# Patient Record
Sex: Male | Born: 1985 | Race: White | Hispanic: No | Marital: Married
Health system: Southern US, Community
[De-identification: ages and names within clinical notes are randomized; demographics above are authoritative.]

---
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
## 2010-02-08 ENCOUNTER — Emergency Department (HOSPITAL_COMMUNITY): Admission: EM | Admit: 2010-02-08 | Discharge: 2010-02-08 | Payer: Self-pay | Admitting: Emergency Medicine

## 2011-02-21 LAB — POCT I-STAT, CHEM 8
Calcium, Ion: 1.15 mmol/L (ref 1.12–1.32)
Chloride: 107 mEq/L (ref 96–112)
Creatinine, Ser: 0.9 mg/dL (ref 0.4–1.5)
Hemoglobin: 15.6 g/dL (ref 13.0–17.0)
Sodium: 138 mEq/L (ref 135–145)
TCO2: 26 mmol/L (ref 0–100)

## 2011-02-21 LAB — CBC
Hemoglobin: 16.5 g/dL (ref 13.0–17.0)
RBC: 5.32 MIL/uL (ref 4.22–5.81)
WBC: 7.5 10*3/uL (ref 4.0–10.5)

## 2021-02-16 ENCOUNTER — Other Ambulatory Visit: Payer: Self-pay

## 2021-02-17 ENCOUNTER — Ambulatory Visit (INDEPENDENT_AMBULATORY_CARE_PROVIDER_SITE_OTHER): Payer: 59

## 2021-02-17 ENCOUNTER — Ambulatory Visit: Payer: 59 | Admitting: Podiatry

## 2021-02-17 ENCOUNTER — Other Ambulatory Visit: Payer: Self-pay

## 2021-02-17 DIAGNOSIS — S8265XA Nondisplaced fracture of lateral malleolus of left fibula, initial encounter for closed fracture: Secondary | ICD-10-CM

## 2021-02-17 DIAGNOSIS — S82899A Other fracture of unspecified lower leg, initial encounter for closed fracture: Secondary | ICD-10-CM | POA: Diagnosis not present

## 2021-02-17 DIAGNOSIS — S92009A Unspecified fracture of unspecified calcaneus, initial encounter for closed fracture: Secondary | ICD-10-CM

## 2021-02-18 ENCOUNTER — Encounter: Payer: Self-pay | Admitting: Podiatry

## 2021-02-18 NOTE — Progress Notes (Signed)
Subjective:  Patient ID: Reginald Perez, male    DOB: 1986-01-11,  MRN: [id number]  Chief Complaint  Patient presents with  . Fracture    PT stated that he was walking his dog in [place] and he slipped on some mud and he heard something pop and he went to the ER and they told him that he broke his fibula.     35 y.o. male presents with the above complaint.  Patient presents with left ankle fracture that occurred few days ago while he was walking his dog in [place].  He slipped and felt a pop.  He went to the ER and they said that they broke a bone.  He was placed in a cast and was sent to follow-up near his home which will be here.  He has not seen anyone else prior to seeing me here.  He has not seen any foot and ankle specialist.  He denies any other acute complaints.  He would like to discuss if there is surgical intervention is required or can be treated conservatively.  He has been nonweightbearing with crutches to the left lower extremity.   Review of Systems: Negative except as noted in the HPI. Denies N/V/F/Ch.  No past medical history on file.  Current Outpatient Medications:  .  cephALEXin (KEFLEX) 500 MG capsule, Take 500 mg by mouth 3 (three) times daily., Disp: , Rfl:  .  finasteride (PROPECIA) 1 MG tablet, Take 1 mg by mouth daily., Disp: , Rfl:  .  ibuprofen (ADVIL) 800 MG tablet, Take 800 mg by mouth every 8 (eight) hours as needed. for pain, Disp: , Rfl:  .  meloxicam (MOBIC) 7.5 MG tablet, Take 7.5 mg by mouth daily., Disp: , Rfl:  .  Meth-Hyo-M Bl-Na Phos-Ph Sal (PHOSPHASAL) 81.6 MG TABS, Take 1 tablet by mouth 4 (four) times daily as needed., Disp: , Rfl:  .  oxyCODONE (OXY IR/ROXICODONE) 5 MG immediate release tablet, Take 5 mg by mouth every 6 (six) hours as needed., Disp: , Rfl:  .  sulfamethoxazole-trimethoprim (BACTRIM DS) 800-160 MG tablet, Take 1 tablet by mouth 2 (two) times daily., Disp: , Rfl:   Social History   Tobacco Use  Smoking Status Not on file   Smokeless Tobacco Not on file    Not on File Objective:  There were no vitals filed for this visit. There is no height or weight on file to calculate BMI. Constitutional Well developed. Well nourished.  Vascular Dorsalis pedis pulses palpable bilaterally. Posterior tibial pulses palpable bilaterally. Capillary refill normal to all digits.  No cyanosis or clubbing noted. Pedal hair growth normal.  Neurologic Normal speech. Oriented to person, place, and time. Epicritic sensation to light touch grossly present bilaterally.  Dermatologic Nails well groomed and normal in appearance. No open wounds. No skin lesions.  Orthopedic:  Pain on palpation to the left lateral ankle.  No edema present.  Cast is clean dry and intact.  Mild ecchymosis present.  Motor or sensory function functions intact.  No calf pain.  Palpable pulses   Radiographs: 3 views of skeletally mature adult left ankle.  Fibular fracture noted that is spiral oblique in nature to the left ankle.  No angulation noted.  Mild shortening of the fibula noted when evaluated with dime sign.  No medial malleoli or gapping noted.  No increase in medial clear space.  Ankle mortise within normal limits.  No posterior malleolus fracture noted.  There may be a component of tibial  fracture involved that is limited by the view obtained. Assessment:   1. Closed nondisplaced fracture of lateral malleolus of left fibula, initial encounter    Plan:  Patient was evaluated and treated and all questions answered.  Left fibular fracture with the possibility of tibial involvement -I explained to the patient the etiology of fibular fracture measurement options were discussed.  Patient is currently in a sugar tong splint which appears to be clean dry and intact with the Jones compression sleeve to help with the edema.  I discussed with him that we can keep utilizing the same sugar tong splint.  I reviewed the x-rays with patient extensive detail.  At  this time I discussed with the patient that if is just isolated fibula fracture we can monitor with conservative treatment.  However there may be a concern for tibial involvement as well.  Radiographically I am limited by the view.  However I believe patient will benefit from a CT scan to further evaluate the involvement of the tibia.  If there is multiple breaks that make of the ankle joint I discussed with the patient that he will need surgical intervention to help address that.  He states understanding. -He will be NWB LLE with crutches. Encouraged him to obtain the knee scooter. -CT scan to the left lower extremity was ordered.  No follow-ups on file.

## 2021-03-03 ENCOUNTER — Ambulatory Visit: Payer: 59 | Admitting: Podiatry

## 2021-03-03 ENCOUNTER — Other Ambulatory Visit: Payer: Self-pay

## 2021-03-03 ENCOUNTER — Ambulatory Visit
Admission: RE | Admit: 2021-03-03 | Discharge: 2021-03-03 | Disposition: A | Discharge status: Home / Self Care | Payer: 59 | Source: Ambulatory Visit | Attending: Podiatry | Admitting: Podiatry

## 2021-03-03 DIAGNOSIS — S82899A Other fracture of unspecified lower leg, initial encounter for closed fracture: Secondary | ICD-10-CM

## 2021-03-04 ENCOUNTER — Ambulatory Visit: Payer: 59

## 2021-03-04 ENCOUNTER — Ambulatory Visit: Payer: 59 | Admitting: Podiatry

## 2021-03-04 DIAGNOSIS — S8265XA Nondisplaced fracture of lateral malleolus of left fibula, initial encounter for closed fracture: Secondary | ICD-10-CM

## 2021-03-05 ENCOUNTER — Encounter: Payer: Self-pay | Admitting: Podiatry

## 2021-03-05 NOTE — Progress Notes (Signed)
Subjective:  Patient ID: Reginald Perez, male    DOB: 28-May-1986,  MRN: [id number]  Chief Complaint  Patient presents with  . Ankle Injury    Discuss CT results.  Request to go into a boot    35 y.o. male presents with the above complaint.  Patient presents following left ankle fracture.  He just had a CT scan done.  He would like to discuss his new treatment options.  He has brought his knee scooter with him.  He denies any other acute complaints.  This injury happened in [place].   Review of Systems: Negative except as noted in the HPI. Denies N/V/F/Ch.  No past medical history on file.  Current Outpatient Medications:  .  cephALEXin (KEFLEX) 500 MG capsule, Take 500 mg by mouth 3 (three) times daily., Disp: , Rfl:  .  finasteride (PROPECIA) 1 MG tablet, Take 1 mg by mouth daily., Disp: , Rfl:  .  ibuprofen (ADVIL) 800 MG tablet, Take 800 mg by mouth every 8 (eight) hours as needed. for pain, Disp: , Rfl:  .  meloxicam (MOBIC) 7.5 MG tablet, Take 7.5 mg by mouth daily., Disp: , Rfl:  .  Meth-Hyo-M Bl-Na Phos-Ph Sal (PHOSPHASAL) 81.6 MG TABS, Take 1 tablet by mouth 4 (four) times daily as needed., Disp: , Rfl:  .  oxyCODONE (OXY IR/ROXICODONE) 5 MG immediate release tablet, Take 5 mg by mouth every 6 (six) hours as needed., Disp: , Rfl:  .  sulfamethoxazole-trimethoprim (BACTRIM DS) 800-160 MG tablet, Take 1 tablet by mouth 2 (two) times daily., Disp: , Rfl:   Social History   Tobacco Use  Smoking Status Not on file  Smokeless Tobacco Not on file    No Known Allergies Objective:  There were no vitals filed for this visit. There is no height or weight on file to calculate BMI. Constitutional Well developed. Well nourished.  Vascular Dorsalis pedis pulses palpable bilaterally. Posterior tibial pulses palpable bilaterally. Capillary refill normal to all digits.  No cyanosis or clubbing noted. Pedal hair growth normal.  Neurologic Normal speech. Oriented to person, place,  and time. Epicritic sensation to light touch grossly present bilaterally.  Dermatologic Nails well groomed and normal in appearance. No open wounds. No skin lesions.  Orthopedic:  Pain on palpation to the left lateral ankle.  No edema present.  Cast is clean dry and intact.  Mild ecchymosis present.  Motor or sensory function functions intact.  No calf pain.  Palpable pulses   Radiographs: 3 views of skeletally mature adult left ankle.  Fibular fracture noted that is spiral oblique in nature to the left ankle.  No angulation noted.  Mild shortening of the fibula noted when evaluated with dime sign.  No medial malleoli or gapping noted.  No increase in medial clear space.  Ankle mortise within normal limits.  No posterior malleolus fracture noted.  There may be a component of tibial fracture involved that is limited by the view obtained.   1. Nondisplaced oblique coursing fracture of the distal fibular shaft at and above the level of the ankle mortise. 2. Small cortical avulsion fracture involving the anterior aspect of the lateral malleolus likely related to a tibiofibular tendon injury. 3. Subtle nondisplaced fracture through the medial malleolus. 4. No intra-articular fractures. Assessment:   1. Closed nondisplaced fracture of lateral malleolus of left fibula, initial encounter    Plan:  Patient was evaluated and treated and all questions answered.  Left fibular fracture with the possibility  of tibial involvement -I explained to the patient the etiology of fibular fracture measurement options were discussed.  Patient is currently in a sugar tong splint which appears to be clean dry and intact with the Jones compression sleeve to help with the edema.  I discussed with him that we can keep utilizing the same sugar tong splint.  I reviewed the x-rays with patient extensive detail.  -CT scan was reviewed with the patient in extensive detail.  At this time I do not appreciate any medial  malleoli fracture or gapping of the medial clear space.  I discussed with the patient that I would like for him to wear the cam boot and weight-bear on it.  If there is gapping of the fibula there is likely too much instability in the ankle joint then point patient will benefit from surgical intervention.  However for now given that this is very nondisplaced fibular fracture without any other breaks in the ankle mortise I believe he will benefit with conservative treatment options. -I will see him back again in 3 weeks and will get a repeat ankle x-rays and if there is no further displacement of the fibular fracture we will plan on treating it conservatively.  Patient states understanding.  No follow-ups on file.

## 2021-03-26 ENCOUNTER — Other Ambulatory Visit: Payer: Self-pay

## 2021-03-26 ENCOUNTER — Ambulatory Visit: Payer: 59 | Admitting: Podiatry

## 2021-03-26 ENCOUNTER — Ambulatory Visit (INDEPENDENT_AMBULATORY_CARE_PROVIDER_SITE_OTHER): Payer: 59

## 2021-03-26 DIAGNOSIS — S8265XA Nondisplaced fracture of lateral malleolus of left fibula, initial encounter for closed fracture: Secondary | ICD-10-CM | POA: Diagnosis not present

## 2021-03-30 ENCOUNTER — Encounter: Payer: Self-pay | Admitting: Podiatry

## 2021-03-30 NOTE — Progress Notes (Signed)
Subjective:  Patient ID: Reginald Perez, male    DOB: 12-04-1985,  MRN: [id number]  Chief Complaint  Patient presents with  . Fracture    PT stated that he is not having any pain at this time     35 y.o. male presents with the above complaint.  Patient presents following left ankle fracture.  He states he has been weightbearing as with a cam boot.  He denies any pain.  He has not had any issues.   Review of Systems: Negative except as noted in the HPI. Denies N/V/F/Ch.  No past medical history on file.  Current Outpatient Medications:  .  cephALEXin (KEFLEX) 500 MG capsule, Take 500 mg by mouth 3 (three) times daily., Disp: , Rfl:  .  finasteride (PROPECIA) 1 MG tablet, Take 1 mg by mouth daily., Disp: , Rfl:  .  ibuprofen (ADVIL) 800 MG tablet, Take 800 mg by mouth every 8 (eight) hours as needed. for pain, Disp: , Rfl:  .  meloxicam (MOBIC) 7.5 MG tablet, Take 7.5 mg by mouth daily., Disp: , Rfl:  .  Meth-Hyo-M Bl-Na Phos-Ph Sal (PHOSPHASAL) 81.6 MG TABS, Take 1 tablet by mouth 4 (four) times daily as needed., Disp: , Rfl:  .  oxyCODONE (OXY IR/ROXICODONE) 5 MG immediate release tablet, Take 5 mg by mouth every 6 (six) hours as needed., Disp: , Rfl:  .  sulfamethoxazole-trimethoprim (BACTRIM DS) 800-160 MG tablet, Take 1 tablet by mouth 2 (two) times daily., Disp: , Rfl:   Social History   Tobacco Use  Smoking Status Not on file  Smokeless Tobacco Not on file    No Known Allergies Objective:  There were no vitals filed for this visit. There is no height or weight on file to calculate BMI. Constitutional Well developed. Well nourished.  Vascular Dorsalis pedis pulses palpable bilaterally. Posterior tibial pulses palpable bilaterally. Capillary refill normal to all digits.  No cyanosis or clubbing noted. Pedal hair growth normal.  Neurologic Normal speech. Oriented to person, place, and time. Epicritic sensation to light touch grossly present bilaterally.  Dermatologic  Nails well groomed and normal in appearance. No open wounds. No skin lesions.  Orthopedic:  No pain on palpation to the left lateral ankle.  No edema present.  No further ecchymosis present.  Motor or sensory function functions intact.  No calf pain.  Palpable pulses.   Radiographs: 3 views of skeletally mature adult left ankle.  Fibular fracture noted that is spiral oblique in nature to the left ankle.  No angulation noted.  Mild shortening of the fibula noted when evaluated with dime sign.  No medial malleoli or gapping noted.  No increase in medial clear space.  Ankle mortise within normal limits.  No posterior malleolus fracture noted.  There may be a component of tibial fracture involved that is limited by the view obtained.   1. Nondisplaced oblique coursing fracture of the distal fibular shaft at and above the level of the ankle mortise. 2. Small cortical avulsion fracture involving the anterior aspect of the lateral malleolus likely related to a tibiofibular tendon injury. 3. Subtle nondisplaced fracture through the medial malleolus. 4. No intra-articular fractures. Assessment:   1. Closed nondisplaced fracture of lateral malleolus of left fibula, initial encounter    Plan:  Patient was evaluated and treated and all questions answered.  Left fibular fracture with the possibility of tibial involvement -I explained to the patient the etiology of fibular fracture measurement options were discussed.  Patient is  currently in a sugar tong splint which appears to be clean dry and intact with the Jones compression sleeve to help with the edema.  I discussed with him that we can keep utilizing the same sugar tong splint.  I reviewed the x-rays with patient extensive detail.  -CT scan was reviewed with the patient in extensive detail.  At this time I do not appreciate any medial malleoli fracture or gapping of the medial clear space.   -No gapping noted with continuous weightbearing as  tolerated with a cam boot.  At this time patient does not need to be clinically nonweightbearing/fixated surgically.  I have asked him nonweightbearing will increase the rate of which are well-healed.  Patient states understanding and will be more nonweightbearing and weightbearing as tolerated in surgical shoe.  He also has to return back to [place] where he will manage further care for this.  No follow-ups on file.

## 2021-03-30 NOTE — Patient Instructions (Signed)
Now

## 2022-08-11 IMAGING — CT CT ANKLE*L* W/O CM
3 series · 16 of 35 positions shown, 19 images · non-contrast
Comparison: Radiograph 02/17/2021

CLINICAL DATA: Evaluate ankle fracture. Slipped and fell 02/13/2021

EXAM:
CT OF THE LEFT ANKLE WITHOUT CONTRAST
TECHNIQUE: Multidetector CT imaging of the left ankle was performed according
to the standard protocol. Multiplanar CT image reconstructions were
also generated.

[Series 5: sfov lower extremity 2.00 br40 s3 soft · axial · 0.39mm/px · z∈[+573,+727]mm · 8 of 93 slices shown, 10 images (1 of 3)]
[im 8/93  soft-tissue]
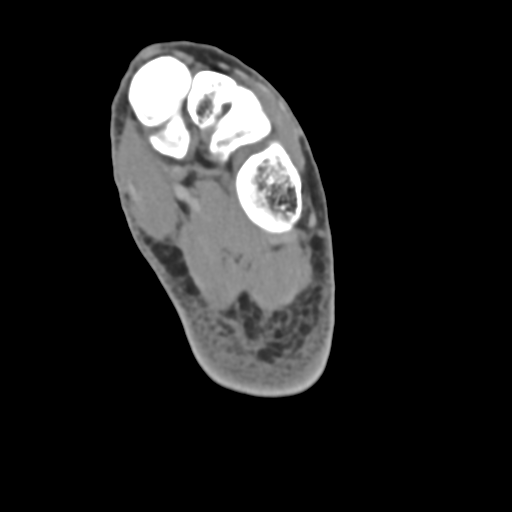
[im 8/93  bone]
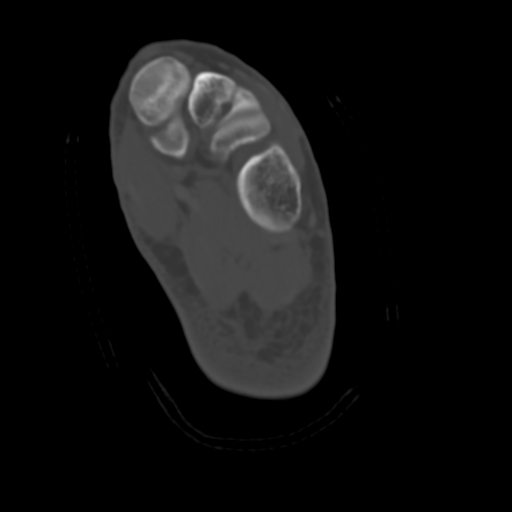
[im 22/93  bone]
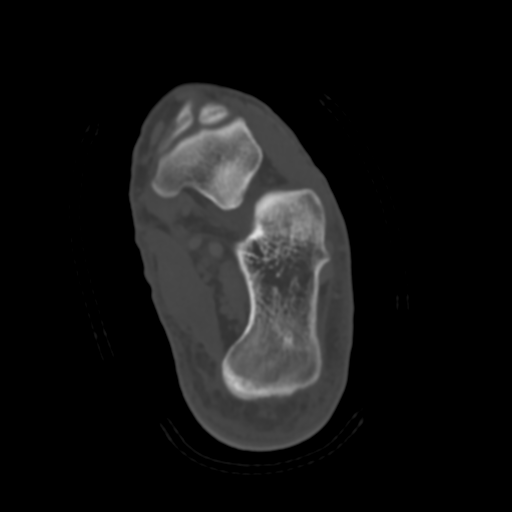
[im 29/93  bone]
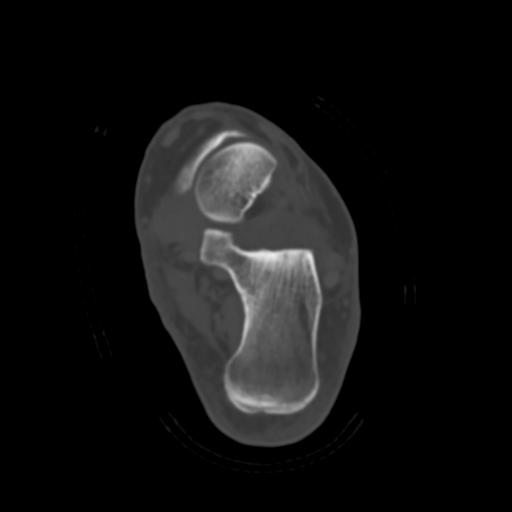
[im 43/93  bone]
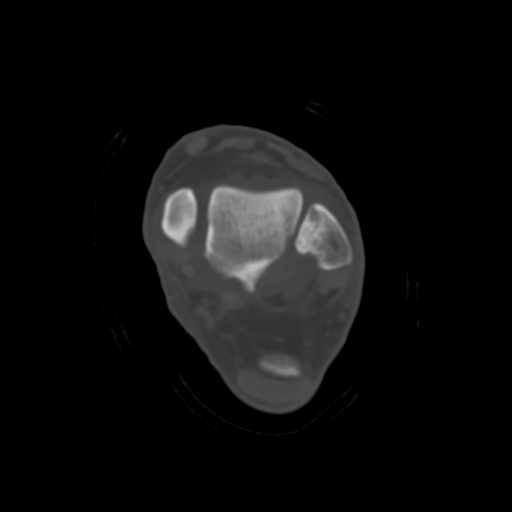
[im 50/93  soft-tissue]
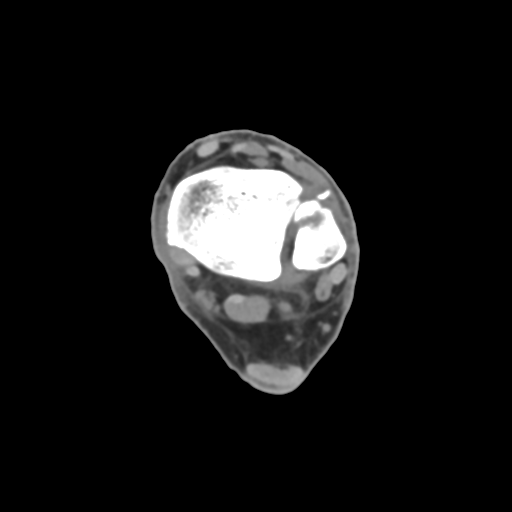
[im 50/93  bone]
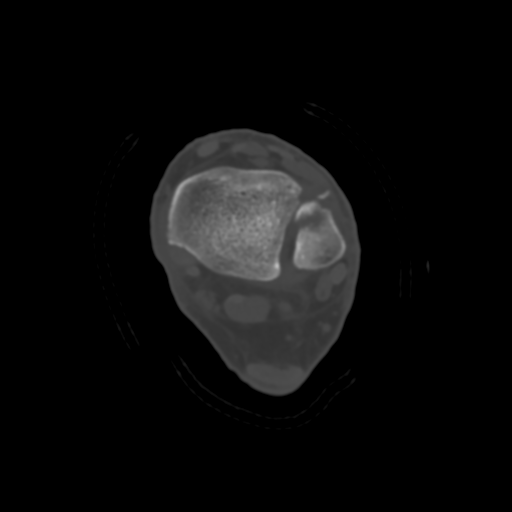
[im 64/93  bone]
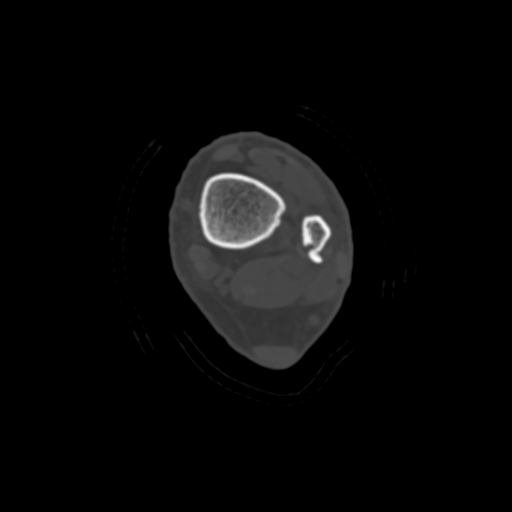
[im 71/93  bone]
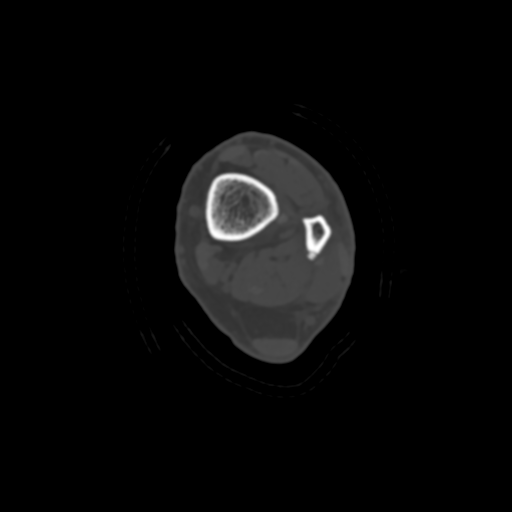
[im 85/93  bone]
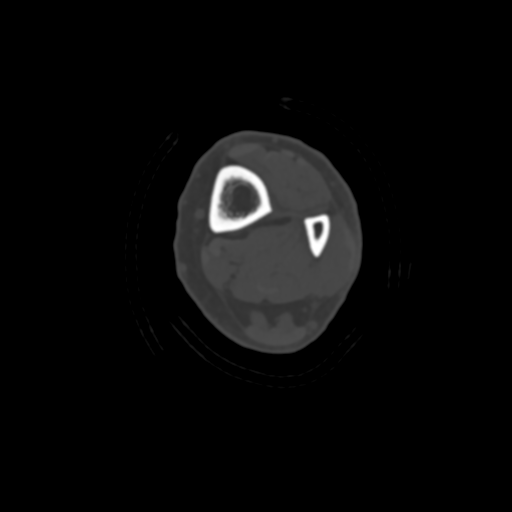

[Series 9: sfov lower extremity 2.00 br40 s3 soft · coronal · 0.36mm/px · 3 of 99 slices shown (2 of 3)]
[im 20/99  bone]
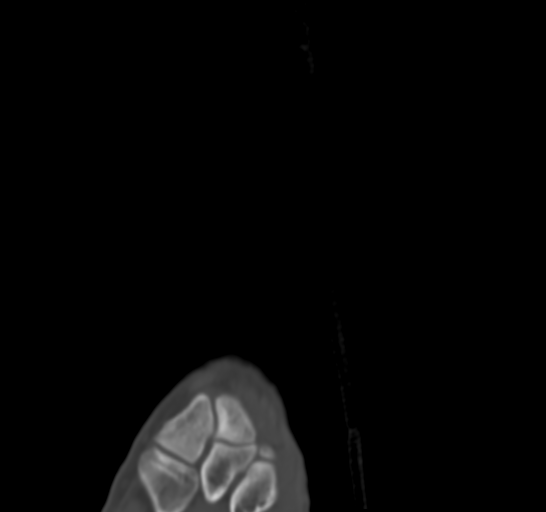
[im 40/99  bone]
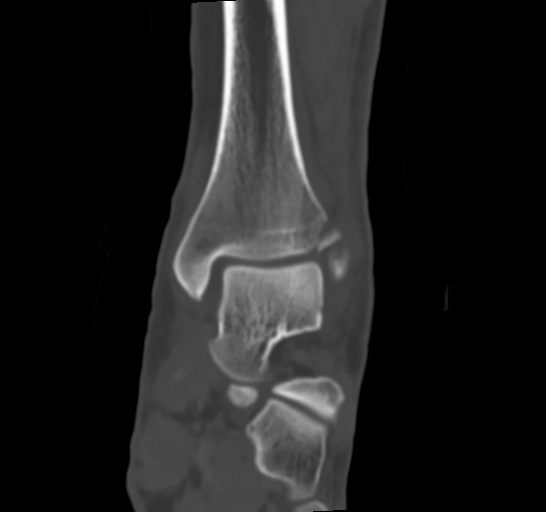
[im 59/99  bone]
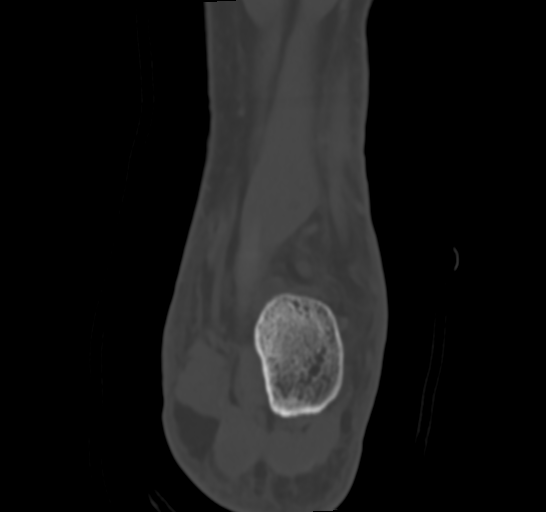

[Series 13: sfov lower extremity 2.00 br40 s3 soft · sagittal · 0.36mm/px · 5 of 99 slices shown, 6 images (3 of 3)]
[im 33/99  bone]
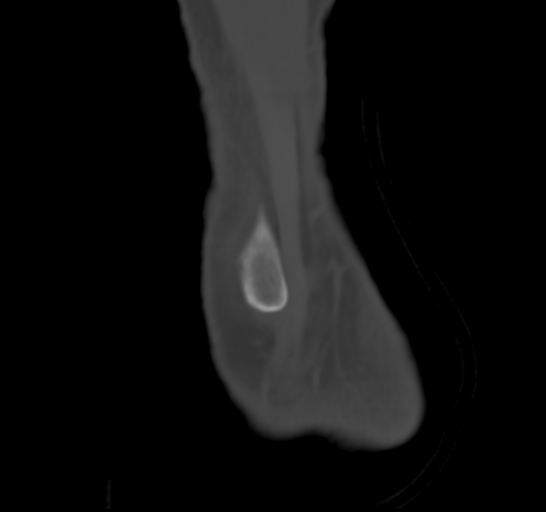
[im 41/99  bone]
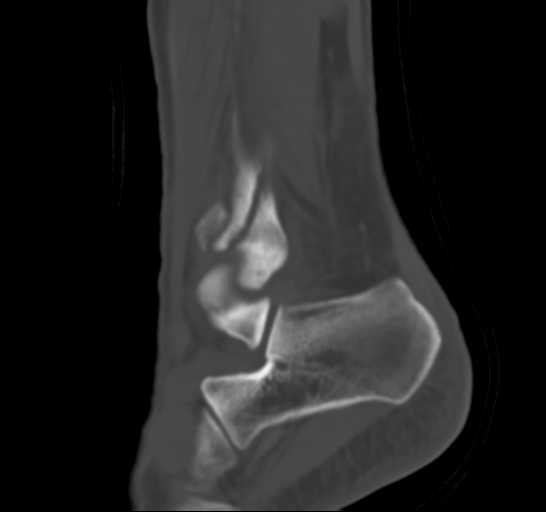
[im 50/99  soft-tissue]
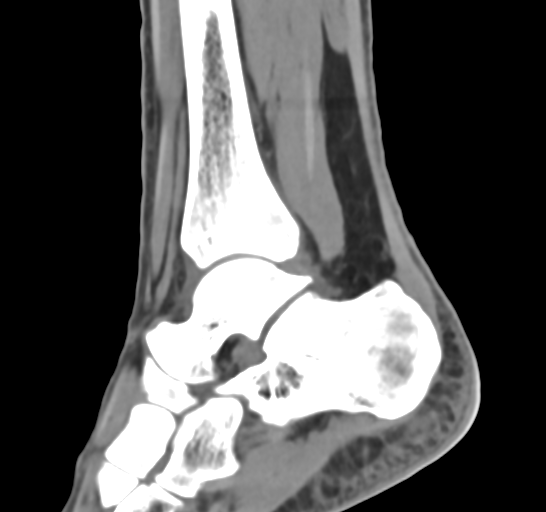
[im 50/99  bone]
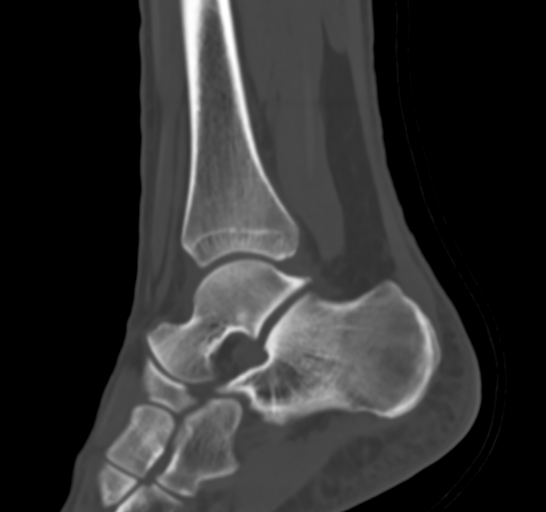
[im 58/99  bone]
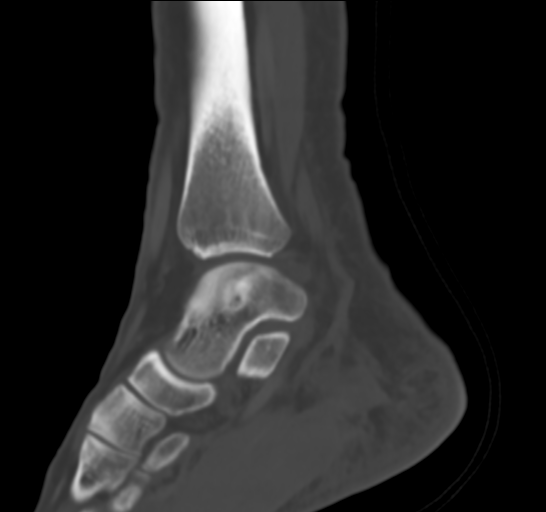
[im 66/99  bone]
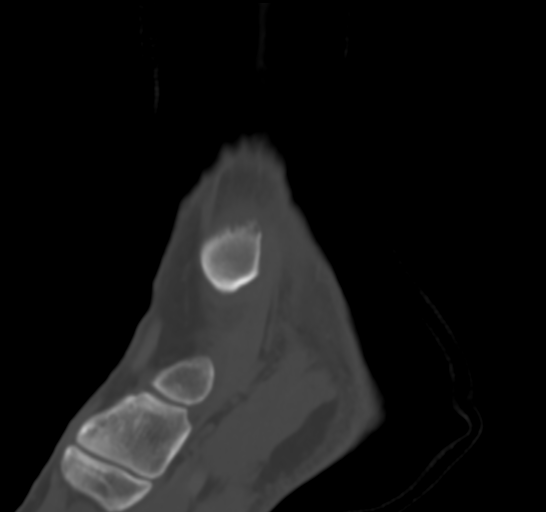

[16 of 35 positions shown; findings below may reference images not displayed]

FINDINGS: There is a nondisplaced oblique coursing fracture of the distal
fibular shaft at and above the level of the ankle mortise.

Small cortical avulsion fracture involving the anterior aspect of
the lateral malleolus likely related to a tibiofibular tendon
injury.

Subtle nondisplaced fracture through the medial malleolus.

No fracture involving the posterior malleolus of the tibia. No
intra-articular fractures.

The talus is intact. The subtalar joints are normal. There is a
benign area of invaginated vascular tissue in the calcaneus near the
sinus tarsi. The visualized mid foot bony structures are intact.

Grossly by CT the major tendons are intact.
IMPRESSION: 1. Nondisplaced oblique coursing fracture of the distal fibular
shaft at and above the level of the ankle mortise.
2. Small cortical avulsion fracture involving the anterior aspect of
the lateral malleolus likely related to a tibiofibular tendon
injury.
3. Subtle nondisplaced fracture through the medial malleolus.
4. No intra-articular fractures.
